# Patient Record
Sex: Female | Born: 1970 | Race: Black or African American | Hispanic: No | Marital: Single | Smoking: Never smoker
Health system: Southern US, Community
[De-identification: ages and names within clinical notes are randomized; demographics above are authoritative.]

## PROBLEM LIST (undated history)

## (undated) DIAGNOSIS — I10 Essential (primary) hypertension: Secondary | ICD-10-CM

## (undated) HISTORY — PX: TUBAL LIGATION: SHX77

## (undated) HISTORY — PX: ABDOMINAL HYSTERECTOMY: SHX81

---

## 2009-09-30 ENCOUNTER — Emergency Department (HOSPITAL_BASED_OUTPATIENT_CLINIC_OR_DEPARTMENT_OTHER): Admission: EM | Admit: 2009-09-30 | Discharge: 2009-09-30 | Payer: Self-pay | Admitting: Emergency Medicine

## 2013-11-01 ENCOUNTER — Ambulatory Visit (INDEPENDENT_AMBULATORY_CARE_PROVIDER_SITE_OTHER): Payer: BC Managed Care – PPO | Admitting: Psychology

## 2013-11-01 DIAGNOSIS — F331 Major depressive disorder, recurrent, moderate: Secondary | ICD-10-CM

## 2013-11-10 ENCOUNTER — Ambulatory Visit (INDEPENDENT_AMBULATORY_CARE_PROVIDER_SITE_OTHER): Payer: BC Managed Care – PPO | Admitting: Psychology

## 2013-11-10 DIAGNOSIS — F331 Major depressive disorder, recurrent, moderate: Secondary | ICD-10-CM

## 2013-11-30 ENCOUNTER — Ambulatory Visit (INDEPENDENT_AMBULATORY_CARE_PROVIDER_SITE_OTHER): Payer: PRIVATE HEALTH INSURANCE | Admitting: Licensed Clinical Social Worker

## 2013-11-30 DIAGNOSIS — F331 Major depressive disorder, recurrent, moderate: Secondary | ICD-10-CM

## 2013-12-01 ENCOUNTER — Ambulatory Visit: Payer: No Typology Code available for payment source | Admitting: Psychology

## 2013-12-02 ENCOUNTER — Ambulatory Visit: Payer: No Typology Code available for payment source | Admitting: Licensed Clinical Social Worker

## 2013-12-09 ENCOUNTER — Ambulatory Visit: Payer: No Typology Code available for payment source | Admitting: Licensed Clinical Social Worker

## 2013-12-10 ENCOUNTER — Encounter (HOSPITAL_BASED_OUTPATIENT_CLINIC_OR_DEPARTMENT_OTHER): Payer: Self-pay | Admitting: Emergency Medicine

## 2013-12-10 ENCOUNTER — Emergency Department (HOSPITAL_BASED_OUTPATIENT_CLINIC_OR_DEPARTMENT_OTHER)
Admission: EM | Admit: 2013-12-10 | Discharge: 2013-12-10 | Disposition: A | Discharge status: Home / Self Care | Payer: BC Managed Care – PPO | Attending: Emergency Medicine | Admitting: Emergency Medicine

## 2013-12-10 ENCOUNTER — Emergency Department (HOSPITAL_BASED_OUTPATIENT_CLINIC_OR_DEPARTMENT_OTHER): Payer: BC Managed Care – PPO

## 2013-12-10 DIAGNOSIS — Y9289 Other specified places as the place of occurrence of the external cause: Secondary | ICD-10-CM | POA: Insufficient documentation

## 2013-12-10 DIAGNOSIS — S93409A Sprain of unspecified ligament of unspecified ankle, initial encounter: Secondary | ICD-10-CM | POA: Insufficient documentation

## 2013-12-10 DIAGNOSIS — I10 Essential (primary) hypertension: Secondary | ICD-10-CM | POA: Insufficient documentation

## 2013-12-10 DIAGNOSIS — Y9389 Activity, other specified: Secondary | ICD-10-CM | POA: Insufficient documentation

## 2013-12-10 DIAGNOSIS — X500XXA Overexertion from strenuous movement or load, initial encounter: Secondary | ICD-10-CM | POA: Insufficient documentation

## 2013-12-10 DIAGNOSIS — Z79899 Other long term (current) drug therapy: Secondary | ICD-10-CM | POA: Insufficient documentation

## 2013-12-10 HISTORY — DX: Morbid (severe) obesity due to excess calories: E66.01

## 2013-12-10 HISTORY — DX: Essential (primary) hypertension: I10

## 2013-12-10 NOTE — Discharge Instructions (Signed)

## 2013-12-10 NOTE — ED Notes (Signed)
NP at bedside.

## 2013-12-10 NOTE — ED Provider Notes (Signed)
CSN: [id number]     Arrival date & time 12/10/13  1514 History   First MD Initiated Contact with Patient 12/10/13 1522     Chief Complaint  Patient presents with  . Ankle Injury     (Consider location/radiation/quality/duration/timing/severity/associated sxs/prior Treatment) HPI Comments: Pt is c/o left ankle pain that started yesterday. Pt states that she was stepping down the stair today and it gave out on her and twisted it. Denies previous injury to the area. No redness or warmth. Pt states that she is able to wt bear  The history is provided by the patient. No language interpreter was used.    Past Medical History  Diagnosis Date  . Hypertension   . Morbid obesity    Past Surgical History  Procedure Laterality Date  . Abdominal hysterectomy    . Tubal ligation     No family history on file. History  Substance Use Topics  . Smoking status: Never Smoker   . Smokeless tobacco: Not on file  . Alcohol Use: Yes     Comment: rarely   OB History   Grav Para Term Preterm Abortions TAB SAB Ect Mult Living                 Review of Systems  Constitutional: Negative.   Respiratory: Negative.   Cardiovascular: Negative.       Allergies  Review of patient's allergies indicates no known allergies.  Home Medications   Prior to Admission medications   Medication Sig Start Date End Date Taking? Authorizing Provider  Diltiazem HCl Coated Beads (DILTIAZEM HCL CD PO) Take by mouth.   Yes Historical Provider, MD  hydrochlorothiazide (HYDRODIURIL) 25 MG tablet Take 25 mg by mouth daily.   Yes Historical Provider, MD  METOPROLOL TARTRATE PO Take by mouth.   Yes Historical Provider, MD  VALSARTAN PO Take by mouth.   Yes Historical Provider, MD   BP 177/116  Pulse 78  Temp(Src) 98.1 F (36.7 C) (Oral)  Resp 16  Ht 5' 3.5" (1.613 m)  Wt 332 lb (150.594 kg)  BMI 57.88 kg/m2  SpO2 96% Physical Exam  Nursing note and vitals reviewed. Constitutional: She is oriented to  person, place, and time.  Cardiovascular: Normal rate and regular rhythm.   Pulmonary/Chest: Effort normal and breath sounds normal.  Musculoskeletal: Normal range of motion.  Swelling to left lateral ankle. No gross deformity to the area  Neurological: She is alert and oriented to person, place, and time.  Skin: Skin is warm and dry.  Psychiatric: She has a normal mood and affect.    ED Course  Procedures (including critical care time) Labs Review Labs Reviewed - No data to display  Imaging Review Dg Ankle Complete Left  12/10/2013   CLINICAL DATA:  Right ankle pain and swelling following a fall.  EXAM: LEFT ANKLE COMPLETE - 3+ VIEW  COMPARISON:  None.  FINDINGS: Diffuse soft tissue swelling. No fracture, dislocation or effusion seen. Inferior calcaneal spur.  IMPRESSION: No fracture.   Electronically Signed   By: Gordan PaymentSteve  Reid M.D.   On: 12/10/2013 16:12     EKG Interpretation None      MDM   Final diagnoses:  Ankle sprain    Pt placed in aso:no bony abnormality noted. Pt is okay to follow up as needed.discussed bp with pt and she is asymptomatic and hasn't taken all of her medication today    Teressa LowerVrinda Rhylee Nunn, NP 12/10/13 1645

## 2013-12-10 NOTE — ED Provider Notes (Signed)
Medical screening examination/treatment/procedure(s) were performed by non-physician practitioner and as supervising physician I was immediately available for consultation/collaboration.   EKG Interpretation None        Lyndy Russman H Jeremi Losito, MD 12/10/13 2123 

## 2013-12-10 NOTE — ED Notes (Signed)
"  My foot gave out on me" yesterday when stepping down off of a step. Twisted left ankle and fell.  Pain to left ankle.  Pt is able to bear weight.

## 2014-04-06 ENCOUNTER — Emergency Department (HOSPITAL_BASED_OUTPATIENT_CLINIC_OR_DEPARTMENT_OTHER)
Admission: EM | Admit: 2014-04-06 | Discharge: 2014-04-06 | Disposition: A | Discharge status: Home / Self Care | Payer: BC Managed Care – PPO | Attending: Emergency Medicine | Admitting: Emergency Medicine

## 2014-04-06 ENCOUNTER — Encounter (HOSPITAL_BASED_OUTPATIENT_CLINIC_OR_DEPARTMENT_OTHER): Payer: Self-pay | Admitting: Emergency Medicine

## 2014-04-06 ENCOUNTER — Emergency Department (HOSPITAL_BASED_OUTPATIENT_CLINIC_OR_DEPARTMENT_OTHER): Payer: BC Managed Care – PPO

## 2014-04-06 DIAGNOSIS — M79609 Pain in unspecified limb: Secondary | ICD-10-CM | POA: Diagnosis present

## 2014-04-06 DIAGNOSIS — I10 Essential (primary) hypertension: Secondary | ICD-10-CM | POA: Diagnosis not present

## 2014-04-06 DIAGNOSIS — Z79899 Other long term (current) drug therapy: Secondary | ICD-10-CM | POA: Diagnosis not present

## 2014-04-06 DIAGNOSIS — M7989 Other specified soft tissue disorders: Secondary | ICD-10-CM | POA: Insufficient documentation

## 2014-04-06 DIAGNOSIS — M79675 Pain in left toe(s): Secondary | ICD-10-CM

## 2014-04-06 MED ORDER — INDOMETHACIN 25 MG PO CAPS: 25.0000 mg | ORAL_CAPSULE | Freq: Three times a day (TID) | ORAL | Status: AC | PRN

## 2014-04-06 NOTE — ED Provider Notes (Signed)
CSN: [id number]     Arrival date & time 04/06/14  1823 History   First MD Initiated Contact with Patient 04/06/14 1931     Chief Complaint  Patient presents with  . Foot Pain     (Consider location/radiation/quality/duration/timing/severity/associated sxs/prior Treatment) HPI Comments: Patient is a 43 year old morbidly obese female for nausea and hypertension who presents to the emergency department complaining of left big toe pain x1 day. Patient reports she woke up this morning with pain in her left big toe with associated swelling and redness. No known injury or trauma. Pain worse with walking. She tried taking ibuprofen with temporary relief of her pain. Denies fever or chills denies history of gout. States she had a steak last night for dinner. No recent alcohol intake.  Patient is a 43 y.o. female presenting with lower extremity pain. The history is provided by the patient.  Foot Pain    Past Medical History  Diagnosis Date  . Hypertension   . Morbid obesity    Past Surgical History  Procedure Laterality Date  . Abdominal hysterectomy    . Tubal ligation     No family history on file. History  Substance Use Topics  . Smoking status: Never Smoker   . Smokeless tobacco: Not on file  . Alcohol Use: Yes     Comment: rarely   OB History   Grav Para Term Preterm Abortions TAB SAB Ect Mult Living                 Review of Systems  Musculoskeletal:       + L big toe pain and swelling.  Skin: Positive for color change.  All other systems reviewed and are negative.     Allergies  Review of patient's allergies indicates no known allergies.  Home Medications   Prior to Admission medications   Medication Sig Start Date End Date Taking? Authorizing Provider  Diltiazem HCl Coated Beads (DILTIAZEM HCL CD PO) Take by mouth.    Historical Provider, MD  hydrochlorothiazide (HYDRODIURIL) 25 MG tablet Take 25 mg by mouth daily.    Historical Provider, MD  indomethacin  (INDOCIN) 25 MG capsule Take 1 capsule (25 mg total) by mouth 3 (three) times daily as needed. 04/06/14   Trevor Mace, PA-C  METOPROLOL TARTRATE PO Take by mouth.    Historical Provider, MD  VALSARTAN PO Take by mouth.    Historical Provider, MD   BP 175/86  Pulse 80  Temp(Src) 98 F (36.7 C) (Oral)  Resp 16  Ht  (1.6 m)  Wt 331 lb 4 oz (150.254 kg)  BMI 58.69 kg/m2  SpO2 98% Physical Exam  Nursing note and vitals reviewed. Constitutional: She is oriented to person, place, and time. She appears well-developed and well-nourished. No distress.  HENT:  Head: Normocephalic and atraumatic.  Mouth/Throat: Oropharynx is clear and moist.  Eyes: Conjunctivae and EOM are normal.  Neck: Normal range of motion. Neck supple.  Cardiovascular: Normal rate, regular rhythm and normal heart sounds.   Pulmonary/Chest: Effort normal and breath sounds normal. No respiratory distress.  Musculoskeletal:  Tender to palpation over left first MTP with swelling and redness. FROM.  Neurological: She is alert and oriented to person, place, and time. No sensory deficit.  Skin: Skin is warm and dry.  Psychiatric: She has a normal mood and affect. Her behavior is normal.    ED Course  Procedures (including critical care time) Labs Review Labs Reviewed - No data to display  Imaging Review Dg Foot Complete Left  04/06/2014   CLINICAL DATA:  Left big toe medial foot swelling.  No known injury.  EXAM: LEFT FOOT - COMPLETE 3+ VIEW  COMPARISON:  Left ankle radiographs obtained at the same time.  FINDINGS: Diffuse soft tissue swelling, most pronounced distally. Mild inferior calcaneal spur formation. No fracture, bone destruction or periosteal reaction. No soft tissue gas.  IMPRESSION: Soft tissue swelling without acute underlying bony abnormality.   Electronically Signed   By: Gordan Payment M.D.   On: 04/06/2014 20:06     EKG Interpretation None      MDM   Final diagnoses:  Great toe pain, left    Patient with toe pain, no injury or trauma. She is nontoxic appearing and in no apparent distress. Afebrile, vital signs stable. Hypertensive at 175/86. Her edema and swelling noted over first MTP. This does not appear to be cellulitis. Most likely gout despite no history. X-ray without any acute finding. Will treat with indomethacin as ibuprofen has been somewhat helping her. Advised her to followup with her primary care physician for reevaluation. Stable for d/c. Return precautions given. Patient states understanding of treatment care plan and is agreeable.  Trevor Mace, PA-C 04/06/14 2032

## 2014-04-06 NOTE — ED Notes (Signed)
Pt c/o pain to left big toe. Denies injury. Redness noted

## 2014-04-06 NOTE — Discharge Instructions (Signed)
Take indomethacin as directed as needed for pain. Apply ice to your foot.  Gout Gout is an inflammatory arthritis caused by a buildup of uric acid crystals in the joints. Uric acid is a chemical that is normally present in the blood. When the level of uric acid in the blood is too high it can form crystals that deposit in your joints and tissues. This causes joint redness, soreness, and swelling (inflammation). Repeat attacks are common. Over time, uric acid crystals can form into masses (tophi) near a joint, destroying bone and causing disfigurement. Gout is treatable and often preventable. CAUSES  The disease begins with elevated levels of uric acid in the blood. Uric acid is produced by your body when it breaks down a naturally found substance called purines. Certain foods you eat, such as meats and fish, contain high amounts of purines. Causes of an elevated uric acid level include:  Being passed down from parent to child (heredity).  Diseases that cause increased uric acid production (such as obesity, psoriasis, and certain cancers).  Excessive alcohol use.  Diet, especially diets rich in meat and seafood.  Medicines, including certain cancer-fighting medicines (chemotherapy), water pills (diuretics), and aspirin.  Chronic kidney disease. The kidneys are no longer able to remove uric acid well.  Problems with metabolism. Conditions strongly associated with gout include:  Obesity.  High blood pressure.  High cholesterol.  Diabetes. Not everyone with elevated uric acid levels gets gout. It is not understood why some people get gout and others do not. Surgery, joint injury, and eating too much of certain foods are some of the factors that can lead to gout attacks. SYMPTOMS   An attack of gout comes on quickly. It causes intense pain with redness, swelling, and warmth in a joint.  Fever can occur.  Often, only one joint is involved. Certain joints are more commonly  involved:  Base of the big toe.  Knee.  Ankle.  Wrist.  Finger. Without treatment, an attack usually goes away in a few days to weeks. Between attacks, you usually will not have symptoms, which is different from many other forms of arthritis. DIAGNOSIS  Your caregiver will suspect gout based on your symptoms and exam. In some cases, tests may be recommended. The tests may include:  Blood tests.  Urine tests.  X-rays.  Joint fluid exam. This exam requires a needle to remove fluid from the joint (arthrocentesis). Using a microscope, gout is confirmed when uric acid crystals are seen in the joint fluid. TREATMENT  There are two phases to gout treatment: treating the sudden onset (acute) attack and preventing attacks (prophylaxis).  Treatment of an Acute Attack.  Medicines are used. These include anti-inflammatory medicines or steroid medicines.  An injection of steroid medicine into the affected joint is sometimes necessary.  The painful joint is rested. Movement can worsen the arthritis.  You may use warm or cold treatments on painful joints, depending which works best for you.  Treatment to Prevent Attacks.  If you suffer from frequent gout attacks, your caregiver may advise preventive medicine. These medicines are started after the acute attack subsides. These medicines either help your kidneys eliminate uric acid from your body or decrease your uric acid production. You may need to stay on these medicines for a very long time.  The early phase of treatment with preventive medicine can be associated with an increase in acute gout attacks. For this reason, during the first few months of treatment, your caregiver may also  advise you to take medicines usually used for acute gout treatment. Be sure you understand your caregiver's directions. Your caregiver may make several adjustments to your medicine dose before these medicines are effective.  Discuss dietary treatment with your  caregiver or dietitian. Alcohol and drinks high in sugar and fructose and foods such as meat, poultry, and seafood can increase uric acid levels. Your caregiver or dietitian can advise you on drinks and foods that should be limited. HOME CARE INSTRUCTIONS   Do not take aspirin to relieve pain. This raises uric acid levels.  Only take over-the-counter or prescription medicines for pain, discomfort, or fever as directed by your caregiver.  Rest the joint as much as possible. When in bed, keep sheets and blankets off painful areas.  Keep the affected joint raised (elevated).  Apply warm or cold treatments to painful joints. Use of warm or cold treatments depends on which works best for you.  Use crutches if the painful joint is in your leg.  Drink enough fluids to keep your urine clear or pale yellow. This helps your body get rid of uric acid. Limit alcohol, sugary drinks, and fructose drinks.  Follow your dietary instructions. Pay careful attention to the amount of protein you eat. Your daily diet should emphasize fruits, vegetables, whole grains, and fat-free or low-fat milk products. Discuss the use of coffee, vitamin C, and cherries with your caregiver or dietitian. These may be helpful in lowering uric acid levels.  Maintain a healthy body weight. SEEK MEDICAL CARE IF:   You develop diarrhea, vomiting, or any side effects from medicines.  You do not feel better in 24 hours, or you are getting worse. SEEK IMMEDIATE MEDICAL CARE IF:   Your joint becomes suddenly more tender, and you have chills or a fever. MAKE SURE YOU:   Understand these instructions.  Will watch your condition.  Will get help right away if you are not doing well or get worse. Document Released: 07/26/2000 Document Revised: 12/13/2013 Document Reviewed: 03/11/2012 [facility] Patient Information 2015 [place], [place]. This information is not intended to replace advice given to you by your health care provider. Make  sure you discuss any questions you have with your health care provider.

## 2014-04-11 NOTE — ED Provider Notes (Signed)
Medical screening examination/treatment/procedure(s) were performed by non-physician practitioner and as supervising physician I was immediately available for consultation/collaboration.   EKG Interpretation None        Celita Aron, MD 04/11/14 1137 

## 2014-05-12 DEATH — deceased

## 2015-10-18 IMAGING — CR DG FOOT COMPLETE 3+V*L*
3 series · 3 of 3 positions shown · non-contrast
Comparison: Left ankle radiographs obtained at the same time.

CLINICAL DATA: Left big toe medial foot swelling.  No known injury.

EXAM:
LEFT FOOT - COMPLETE 3+ VIEW

[t foot ap left]
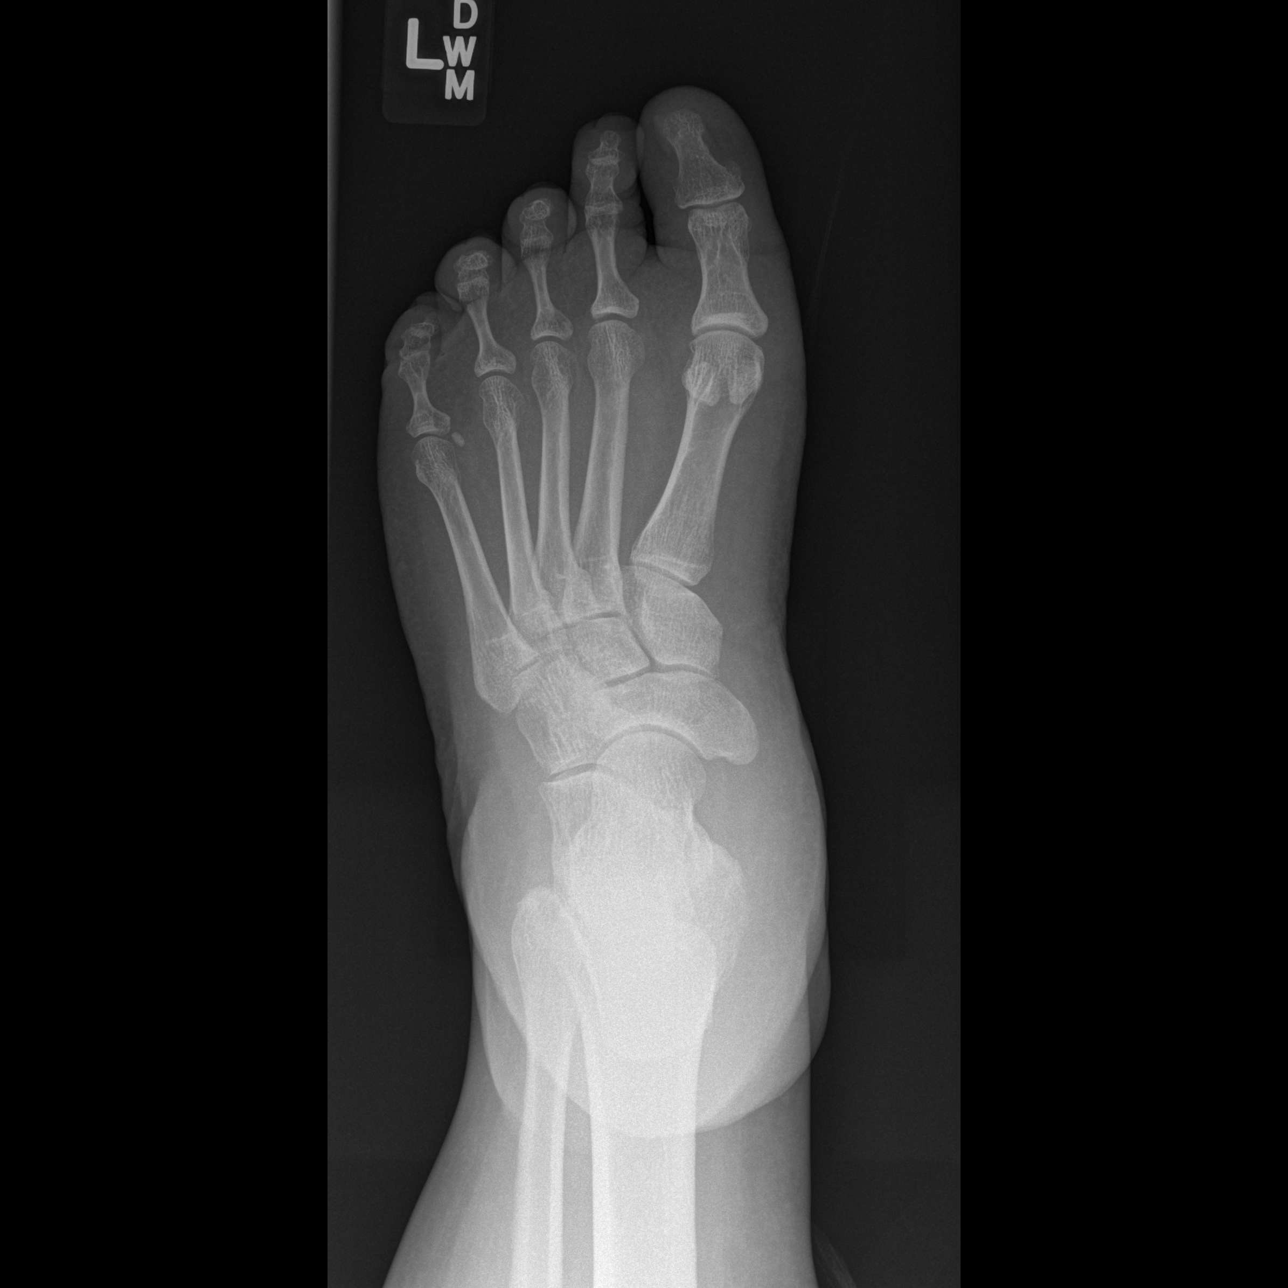

[t foot lat left]
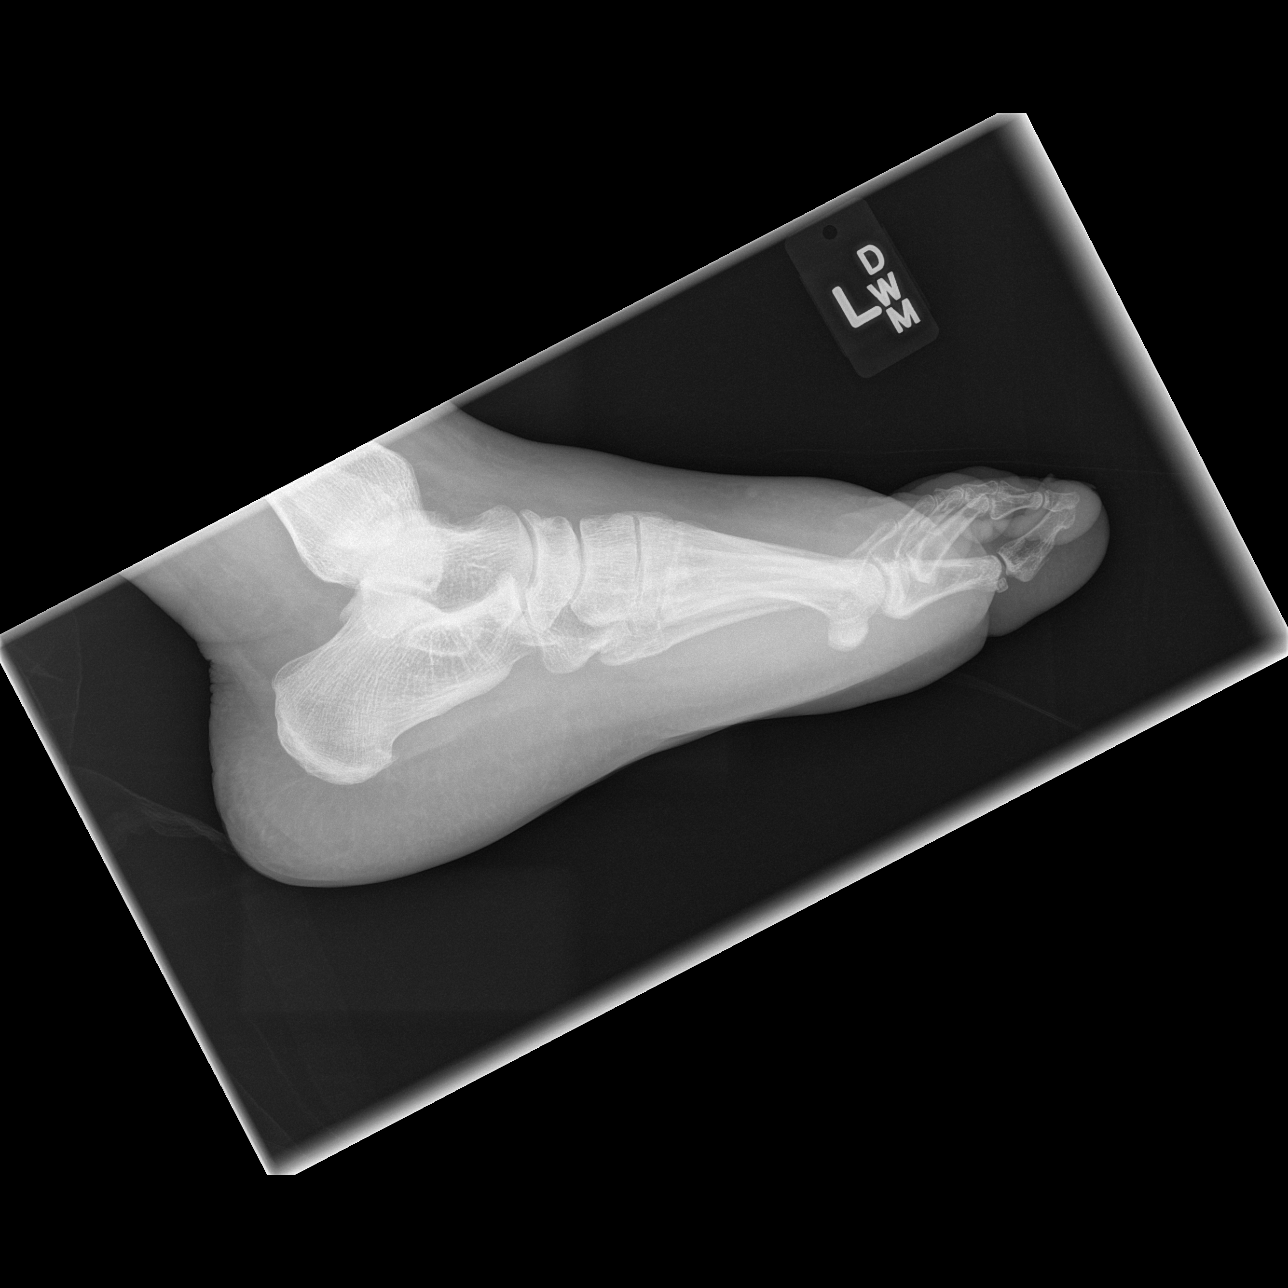

[t foot oblique left]
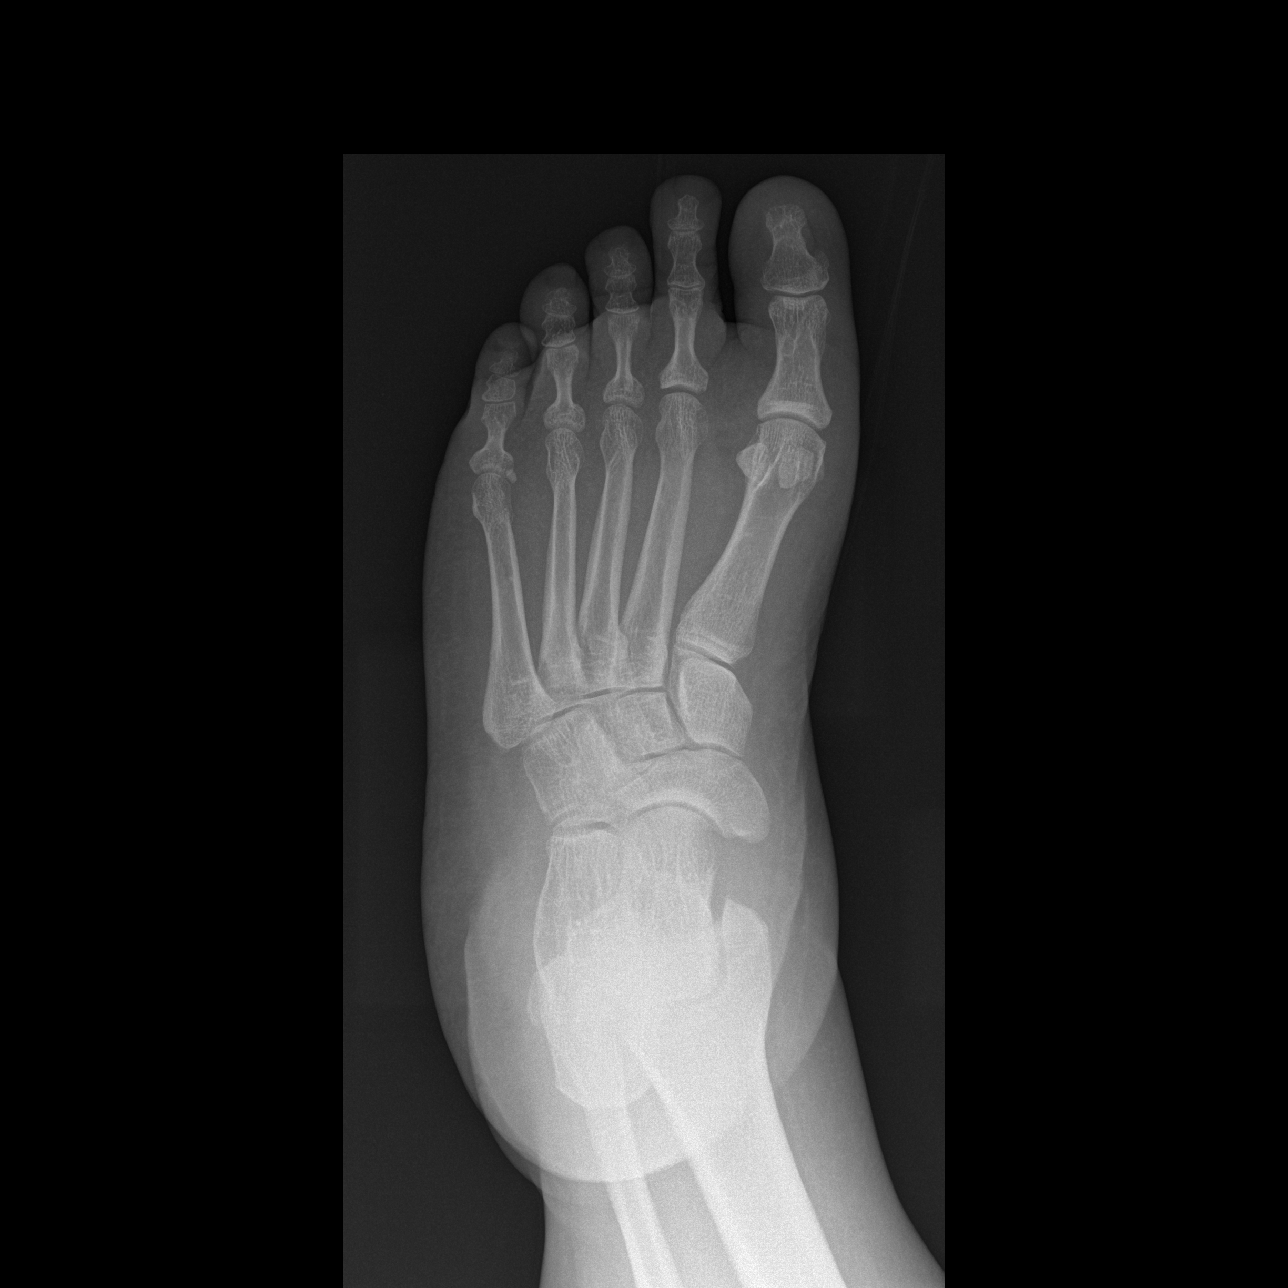

[3 of 3 positions shown; findings below may reference images not displayed]

FINDINGS: Diffuse soft tissue swelling, most pronounced distally. Mild
inferior calcaneal spur formation. No fracture, bone destruction or
periosteal reaction. No soft tissue gas.
IMPRESSION: Soft tissue swelling without acute underlying bony abnormality.
